# Patient Record
Sex: Female | Born: 2000 | Race: Black or African American | Hispanic: No | Marital: Single | Smoking: Never smoker
Health system: Southern US, Community
[De-identification: ages and names within clinical notes are randomized; demographics above are authoritative.]

## PROBLEM LIST (undated history)

## (undated) DIAGNOSIS — F32A Depression, unspecified: Secondary | ICD-10-CM

## (undated) DIAGNOSIS — F419 Anxiety disorder, unspecified: Secondary | ICD-10-CM

## (undated) DIAGNOSIS — E282 Polycystic ovarian syndrome: Secondary | ICD-10-CM

## (undated) DIAGNOSIS — J45909 Unspecified asthma, uncomplicated: Secondary | ICD-10-CM

## (undated) DIAGNOSIS — Z5189 Encounter for other specified aftercare: Secondary | ICD-10-CM

## (undated) DIAGNOSIS — K259 Gastric ulcer, unspecified as acute or chronic, without hemorrhage or perforation: Secondary | ICD-10-CM

## (undated) HISTORY — DX: Anxiety disorder, unspecified: F41.9

## (undated) HISTORY — DX: Depression, unspecified: F32.A

## (undated) HISTORY — DX: Encounter for other specified aftercare: Z51.89

---
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
## 2019-03-17 ENCOUNTER — Ambulatory Visit: Payer: Medicaid Other | Admitting: Family

## 2019-03-31 ENCOUNTER — Telehealth: Payer: Self-pay | Admitting: Family

## 2019-03-31 NOTE — Telephone Encounter (Signed)
Tried calling mom. Unable to LVM. The upcoming appointment with Hoyt Koch will need to be rescheduled, as she will not be available next week. Can be rescheduled for a virtual 30 min follow up with Alyse Low or Chrys Racer, and on RN schedule for labs.

## 2019-04-05 ENCOUNTER — Ambulatory Visit: Payer: Medicaid Other | Admitting: Family

## 2019-04-06 ENCOUNTER — Other Ambulatory Visit: Payer: Self-pay | Admitting: Pediatrics

## 2019-04-06 ENCOUNTER — Telehealth: Payer: Self-pay

## 2019-04-06 DIAGNOSIS — R0602 Shortness of breath: Secondary | ICD-10-CM

## 2019-04-06 MED ORDER — PROAIR HFA 108 (90 BASE) MCG/ACT IN AERS: INHALATION_SPRAY | RESPIRATORY_TRACT | 0 refills | Status: DC

## 2019-04-06 NOTE — Telephone Encounter (Signed)
Pt called upset due to rescheduled appointment due to Hoyt Koch, NP being out this week. She never received a call letting her know that appointment was cancelled. She asks for refill of asthma med (last prescribed by Chrys Racer) and RN rescheduled appointment for soonest avail virtual visit on Monday. Mom states she needed something for pain (other than Ibuprofen). Supervisor plans on calling patient and pain can be addressed at virtual visit on Monday.

## 2019-04-06 NOTE — Telephone Encounter (Signed)
Inhaler sent. Will address pain further at follow up visit.

## 2019-04-12 ENCOUNTER — Ambulatory Visit (INDEPENDENT_AMBULATORY_CARE_PROVIDER_SITE_OTHER): Payer: Medicaid Other | Admitting: Pediatrics

## 2019-04-12 DIAGNOSIS — N926 Irregular menstruation, unspecified: Secondary | ICD-10-CM | POA: Diagnosis not present

## 2019-04-12 DIAGNOSIS — E282 Polycystic ovarian syndrome: Secondary | ICD-10-CM

## 2019-04-12 DIAGNOSIS — E559 Vitamin D deficiency, unspecified: Secondary | ICD-10-CM | POA: Diagnosis not present

## 2019-04-12 DIAGNOSIS — R0602 Shortness of breath: Secondary | ICD-10-CM

## 2019-04-12 MED ORDER — PROAIR HFA 108 (90 BASE) MCG/ACT IN AERS: INHALATION_SPRAY | RESPIRATORY_TRACT | 1 refills | Status: DC

## 2019-04-12 MED ORDER — JUNEL FE 24 1-20 MG-MCG(24) PO TABS: 1.0000 | ORAL_TABLET | Freq: Every day | ORAL | 4 refills | Status: DC

## 2019-04-12 NOTE — Progress Notes (Signed)
I have reviewed the resident's note and plan of care and helped develop the plan as necessary.   Needs referral to internal/family med for pcp needs ongoing.  Will see in office in 3 months for labs and exam.  Continue ibuprofen for headaches- concern re: covid and nsaids discussed and patient agreeable.  Continue albuterol as needed.  Continue ocp, ok to stay off metformin.   Jonathon Resides, FNP

## 2019-04-12 NOTE — Progress Notes (Signed)
Virtual Visit via Video Note  I connected with Maria [name] on 04/12/19 at  4:00 PM EDT by a video enabled telemedicine application and verified that I am speaking with the correct person using two identifiers.   Location of patient/parent: home   I discussed the limitations of evaluation and management by telemedicine and the availability of in person appointments.  I discussed that the purpose of this telehealth visit is to provide medical care while limiting exposure to the novel coronavirus.  The patient expressed understanding and agreed to proceed.  Reason for visit: Follow up PCOS  History of Present Illness:  Maria [name] is a 18 year old with PCOS, asthma who was seen in video visit for follow.  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 No recent issues, school is going well.   Continues on COC without changes in menses. Was previously on Metformin but did not note a benefit so stopped taking. Denies changes in her weight. Denies issues with acne or new rashes.   Mood has been generally good. No specific complaints today. Reports uses albuterol inhaler ~3 times per week.   Gets headaches 1-2 times per month, used to take Ibuprofen but stopped due to concerns raised about NSAIDs and COVID. No other associated symptoms and do not seem to be that debilitating to her.    Observations/Objective:  Pleasant, well-appearing. Normal WOB. Alert and attentive with normal speech.   Assessment and Plan:  1. PCOS: Previously on Metformin but had stopped it as was not seeing benefit. Most recentl labs 12/2017 with HbA1c 5.4.  - Continue COC - Due for repeat labs, will need in-person visit in 3 months for labs and exam  2. Irregular Menses - Continue COC  3. Vitamin D deficiency: Vitamin D level 19 last year. Started Vitamin D supplement.  - Continue Vitamin D - Recheck level at next follow up  4. Headaches: 1-2 times  per month, responds to ibuprofen.  - Ibuprofen PRN  - Referral to PCP provider for ongoing management  5. Asthma: Uses albuterol inhaler ~3 times per week. Never been on daily controller med.  - Continue albuterol PRN - Referral to PCP provider for ongoing management  Follow Up Instructions:  3 month in-person visit for PCOS follow up and labs   I discussed the assessment and treatment plan with the patient and/or parent/guardian. They were provided an opportunity to ask questions and all were answered. They agreed with the plan and demonstrated an understanding of the instructions.   They were advised to call back or seek an in-person evaluation in the emergency room if the symptoms worsen or if the condition fails to improve as anticipated.  I spent 15 minutes on this telehealth visit inclusive of face-to-face video and care coordination time I was located remote during this encounter.  Johnnette Litter, MD

## 2019-04-22 ENCOUNTER — Other Ambulatory Visit: Payer: Self-pay | Admitting: Pediatrics

## 2019-04-22 ENCOUNTER — Telehealth: Payer: Self-pay | Admitting: Pediatrics

## 2019-04-22 DIAGNOSIS — E282 Polycystic ovarian syndrome: Secondary | ICD-10-CM

## 2019-04-22 MED ORDER — JUNEL FE 24 1-20 MG-MCG(24) PO TABS: 1.0000 | ORAL_TABLET | Freq: Every day | ORAL | 4 refills | Status: DC

## 2019-04-22 NOTE — Telephone Encounter (Signed)
Patient and patients parents called and stated that they were unable to receive the medication prescribed from the patients most recent visit with [place] on 04/12/2019. They would like for the medication to be resent. They can be contacted at the primary number in the chart if there is any questions or when the medication is re-sent to the pharmacy.

## 2019-04-22 NOTE — Telephone Encounter (Signed)
ocp resent to [facility]

## 2019-04-23 NOTE — Telephone Encounter (Signed)
Called number on file, no answer, left VM to call office back. ° °

## 2019-04-26 ENCOUNTER — Other Ambulatory Visit: Payer: Self-pay | Admitting: Pediatrics

## 2020-04-03 ENCOUNTER — Other Ambulatory Visit: Payer: Self-pay

## 2020-04-03 ENCOUNTER — Ambulatory Visit (HOSPITAL_COMMUNITY)
Admission: RE | Admit: 2020-04-03 | Discharge: 2020-04-03 | Disposition: A | Discharge status: Home / Self Care | Payer: Medicaid Other | Source: Ambulatory Visit | Attending: Internal Medicine | Admitting: Internal Medicine

## 2020-04-03 DIAGNOSIS — D649 Anemia, unspecified: Secondary | ICD-10-CM | POA: Diagnosis present

## 2020-04-03 LAB — PREPARE RBC (CROSSMATCH)

## 2020-04-03 LAB — ABO/RH: ABO/RH(D): O POS

## 2020-04-03 MED ORDER — SODIUM CHLORIDE 0.9% IV SOLUTION: Freq: Once | INTRAVENOUS | Status: AC

## 2020-04-03 NOTE — Discharge Instructions (Signed)

## 2020-04-03 NOTE — Progress Notes (Signed)
PATIENT CARE CENTER NOTE  Diagnosis: Anemia    Provider: Jamison Oka, MD   Procedure: 2 units PRBC's    Note: Type & Screen drawn and patient received 2 units of blood via PIV. Tolerated well with no adverse reaction. Vital signs remained stable. Discharge instructions given to patient and patient's mother. Patient alert, oriented and ambulatory at discharge.

## 2020-04-04 LAB — TYPE AND SCREEN
ABO/RH(D): O POS
Antibody Screen: NEGATIVE
Unit division: 0
Unit division: 0

## 2020-04-04 LAB — BPAM RBC
Blood Product Expiration Date: 202110192359
Blood Product Expiration Date: 202110192359
ISSUE DATE / TIME: 202109201045
ISSUE DATE / TIME: 202109201045
Unit Type and Rh: 5100
Unit Type and Rh: 5100

## 2021-07-09 ENCOUNTER — Ambulatory Visit
Admission: EM | Admit: 2021-07-09 | Discharge: 2021-07-09 | Disposition: A | Discharge status: Home / Self Care | Payer: Medicaid Other | Attending: Internal Medicine | Admitting: Internal Medicine

## 2021-07-09 ENCOUNTER — Other Ambulatory Visit: Payer: Self-pay

## 2021-07-09 ENCOUNTER — Encounter: Payer: Self-pay | Admitting: Emergency Medicine

## 2021-07-09 DIAGNOSIS — K219 Gastro-esophageal reflux disease without esophagitis: Secondary | ICD-10-CM | POA: Diagnosis not present

## 2021-07-09 HISTORY — DX: Unspecified asthma, uncomplicated: J45.909

## 2021-07-09 MED ORDER — OMEPRAZOLE 20 MG PO CPDR: 20.0000 mg | DELAYED_RELEASE_CAPSULE | Freq: Every day | ORAL | 0 refills | Status: DC

## 2021-07-09 MED ORDER — SUCRALFATE 1 G PO TABS: 1.0000 g | ORAL_TABLET | Freq: Three times a day (TID) | ORAL | 0 refills | Status: DC

## 2021-07-09 NOTE — ED Notes (Signed)
Unable to collect blood. 3 team members looked for veins. Provider notified.

## 2021-07-09 NOTE — Discharge Instructions (Signed)
It appears that you may have acid reflux or a stomach ulcer.  Blood work is pending.  You have been prescribed 2 medications to help alleviate symptoms.  Please follow-up with gastrointestinal specialist for further evaluation and management.  Go to the hospital if symptoms worsen.

## 2021-07-09 NOTE — ED Provider Notes (Addendum)
[facility]    CSN: [id number] Arrival date & time: 07/09/21  1137      History   Chief Complaint Chief Complaint  Patient presents with   Abdominal Pain   Emesis    HPI Maria [name] is a 20 y.o. female.   Patient presents with epigastric abdominal pain as well as nausea with vomiting that started approximately 9 days ago.  Patient reports that nausea with vomiting is intermittent and they have approximately 1-2 episodes of vomiting a day.  Denies any diarrhea or constipation.  Denies any blood in stool or emesis.  Abdominal pain is located in the epigastric area, is intermittent, and is described as a "burning sensation".  Pain is exacerbated with eating.  Denies chest pain, shortness of breath, fever.   Abdominal Pain Emesis  Past Medical History:  Diagnosis Date   Asthma    PCOS (polycystic ovarian syndrome)     Patient Active Problem List   Diagnosis Date Noted   Frontal headache 02/16/2018   Shortness of breath on exertion 12/22/2017   Extreme poverty 01/12/2015   PCOS (polycystic ovarian syndrome) 05/18/2013   BMI (body mass index), pediatric, 95-99% for age 64/10/2012   Anemia 05/18/2013   Allergic rhinitis 01/13/2013   Acne 01/13/2013   Irregular menses 01/13/2013   Unspecified constipation 01/13/2013    History reviewed. No pertinent surgical history.  OB History   No obstetric history on file.      Home Medications    Prior to Admission medications   Medication Sig Start Date End Date Taking? Authorizing Provider  omeprazole (PRILOSEC) 20 MG capsule Take 1 capsule (20 mg total) by mouth daily. 07/09/21  Yes Donna Silverman, Acie Fredrickson, FNP  sucralfate (CARAFATE) 1 g tablet Take 1 tablet (1 g total) by mouth 4 (four) times daily -  with meals and at bedtime. 07/09/21  Yes Lilianna Case, Rolly Salter E, FNP  Cholecalciferol (VITAMIN D PO) Take 1 tablet by mouth daily.    [provider]  ibuprofen (ADVIL) 800 MG tablet TAKE 1 TABLET BY MOUTH EVERY 8  HOURS AS NEEDED FOR HEADACHE 04/26/19   Alfonso Ramus T, FNP  Multiple Vitamin (MULTIVITAMIN WITH MINERALS) TABS tablet Take 1 tablet by mouth daily.    [provider]  Norethindrone Acetate-Ethinyl Estrad-FE (JUNEL FE 24) 1-20 MG-MCG(24) tablet Take 1 tablet by mouth daily. 04/22/19   Verneda Skill, FNP  PROAIR HFA 108 [phone] Base) MCG/ACT inhaler INHALE 2 PUFFS BY MOUTH EVERY 6 HOURS AS NEEDED FOR WHEEZING AND FOR SHORTNESS OF BREATH 04/12/19   Denton Meek, MD    Family History Family History  Problem Relation Age of Onset   Osteoporosis Mother     Social History Social History   Tobacco Use   Smoking status: Never   Smokeless tobacco: Never  Vaping Use   Vaping Use: Never used  Substance Use Topics   Alcohol use: Yes   Drug use: Never     Allergies   Patient has no known allergies.   Review of Systems Review of Systems Per HPI  Physical Exam Triage Vital Signs ED Triage Vitals  Enc Vitals Group     BP 07/09/21 1251 115/71     Pulse Rate 07/09/21 1251 (!) 120     Resp 07/09/21 1251 20     Temp 07/09/21 1251 98.2 F (36.8 C)     Temp Source 07/09/21 1251 Oral     SpO2 07/09/21 1251 98 %  Weight --      Height --      Head Circumference --      Peak Flow --      Pain Score 07/09/21 1248 7     Pain Loc --      Pain Edu? --      Excl. in GC? --    No data found.  Updated Vital Signs BP 115/71 (BP Location: Left Arm) Comment (BP Location): left forearm, regular cuff   Pulse (!) 120    Temp 98.2 F (36.8 C) (Oral)    Resp 20    LMP 06/04/2021    SpO2 98%   Visual Acuity Right Eye Distance:   Left Eye Distance:   Bilateral Distance:    Right Eye Near:   Left Eye Near:    Bilateral Near:     Physical Exam Constitutional:      General: She is not in acute distress.    Appearance: Normal appearance. She is not toxic-appearing or diaphoretic.  HENT:     Head: Normocephalic and atraumatic.  Eyes:     Extraocular Movements:  Extraocular movements intact.     Conjunctiva/sclera: Conjunctivae normal.  Cardiovascular:     Rate and Rhythm: Normal rate and regular rhythm.     Pulses: Normal pulses.     Heart sounds: Normal heart sounds.  Pulmonary:     Effort: Pulmonary effort is normal. No respiratory distress.     Breath sounds: Normal breath sounds.  Abdominal:     General: Bowel sounds are normal. There is no distension.     Palpations: Abdomen is soft.     Tenderness: There is no abdominal tenderness. There is no right CVA tenderness, left CVA tenderness, guarding or rebound. Negative signs include Murphy's sign, Rovsing's sign, McBurney's sign, psoas sign and obturator sign.  Skin:    General: Skin is dry.  Neurological:     General: No focal deficit present.     Mental Status: She is alert and oriented to person, place, and time. Mental status is at baseline.  Psychiatric:        Mood and Affect: Mood normal.        Behavior: Behavior normal.        Thought Content: Thought content normal.        Judgment: Judgment normal.     UC Treatments / Results  Labs (all labs ordered are listed, but only abnormal results are displayed) Labs Reviewed  COMPREHENSIVE METABOLIC PANEL  CBC    EKG   Radiology No results found.  Procedures Procedures (including critical care time)  Medications Ordered in UC Medications - No data to display  Initial Impression / Assessment and Plan / UC Course  I have reviewed the triage vital signs and the nursing notes.  Pertinent labs & imaging results that were available during my care of the patient were reviewed by me and considered in my medical decision making (see chart for details).     Patient's symptoms are consistent with GERD or stomach ulcer.  Will treat with omeprazole and Carafate.  Patient will need to follow-up with GI specialist for further evaluation management.  Patient provided with contact information for GI specialist.  Nursing staff unable  to obtain a CMP and CBC.  Advised patient to go to the hospital if symptoms significantly worsen.  Discussed strict return precautions.  Patient verbalized understanding and was agreeable with plan. Final Clinical Impressions(s) / UC Diagnoses   Final diagnoses:  Gastroesophageal reflux disease, unspecified whether esophagitis present     Discharge Instructions      It appears that you may have acid reflux or a stomach ulcer.  Blood work is pending.  You have been prescribed 2 medications to help alleviate symptoms.  Please follow-up with gastrointestinal specialist for further evaluation and management.  Go to the hospital if symptoms worsen.    ED Prescriptions     Medication Sig Dispense Auth. Provider   omeprazole (PRILOSEC) 20 MG capsule Take 1 capsule (20 mg total) by mouth daily. 90 capsule [place], [place] E, [place]   sucralfate (CARAFATE) 1 g tablet Take 1 tablet (1 g total) by mouth 4 (four) times daily -  with meals and at bedtime. 170 tablet [place], Acie Fredrickson, [place]      PDMP not reviewed this encounter.   Gustavus Bryant, [place] 07/09/21 1428    Gustavus Bryant, [place] 07/09/21 1450

## 2021-07-09 NOTE — ED Triage Notes (Signed)
Onset of abdominal pain and vomiting since 06/30/2021.  Patient refers to as reflux.  Abdominal pain is epigastric.  Center chest pain is "reflux and burns"

## 2022-11-06 ENCOUNTER — Ambulatory Visit
Admission: EM | Admit: 2022-11-06 | Discharge: 2022-11-06 | Disposition: A | Discharge status: Home / Self Care | Payer: Self-pay | Attending: Physician Assistant | Admitting: Physician Assistant

## 2022-11-06 DIAGNOSIS — R8281 Pyuria: Secondary | ICD-10-CM | POA: Insufficient documentation

## 2022-11-06 DIAGNOSIS — R3129 Other microscopic hematuria: Secondary | ICD-10-CM | POA: Insufficient documentation

## 2022-11-06 HISTORY — DX: Gastric ulcer, unspecified as acute or chronic, without hemorrhage or perforation: K25.9

## 2022-11-06 LAB — POCT URINE PREGNANCY: Preg Test, Ur: NEGATIVE

## 2022-11-06 LAB — POCT URINALYSIS DIP (MANUAL ENTRY)
Glucose, UA: NEGATIVE mg/dL
Nitrite, UA: NEGATIVE
Protein Ur, POC: NEGATIVE mg/dL
Spec Grav, UA: >=1.03 — AB
Urobilinogen, UA: 4 U/dL — AB
pH, UA: 6.5

## 2022-11-06 MED ORDER — ALUMINUM & MAGNESIUM HYDROXIDE 200-200 MG/5ML PO SUSP
20.0000 mL | Freq: Once | ORAL | Status: AC
  Administered 2022-11-06: 20 mL via ORAL

## 2022-11-06 MED ORDER — ACETAMINOPHEN 325 MG PO TABS
650.0000 mg | ORAL_TABLET | Freq: Once | ORAL | Status: AC
  Administered 2022-11-06: 650 mg via ORAL

## 2022-11-06 MED ORDER — ONDANSETRON 4 MG PO TBDP
4.0000 mg | ORAL_TABLET | Freq: Once | ORAL | Status: AC
  Administered 2022-11-06: 4 mg via ORAL

## 2022-11-06 NOTE — Discharge Instructions (Addendum)
At this time it is possible you are having reflux which may be a sign that your stomach ulcers have returned or are aggravated. I recommend the following until you can be evaluated by your PCP.   Try to take something like Pepcid  or Prilosec daily - this will prevent heartburn symptoms and you can take antacids as needed for breakthrough symptoms.  Try to eat small portions of bland foods - boiled chicken, rice, bananas, applesauce, toast, etc until you are feeling better. Once you feel like you are able to, you can try to gradually return to your normal diet as tolerated.  Make sure you are staying well hydrated with plenty of water- at least 75 oz per day. Avoid NSAIDs- things like Ibuprofen, Advil, Aleve as these will aggravate reflux and ulcers if that is what is going on.  If your pain becomes worse, you are not able to eat or drink, you develop fevers, severe vomiting, dizziness please seek prompt emergency medical attention.

## 2022-11-06 NOTE — ED Triage Notes (Signed)
Pt c/o dizziness/light-headedness since Monday. Has had 3 episodes today, in which vision somewhat blacked out. Denies any syncope. Pt also c/o nausea with epigatric pain since Monday. Hx of stomach ulcer; feels similar. Has taken antacids today with little relief.

## 2022-11-06 NOTE — ED Provider Notes (Signed)
[facility]    CSN: [id number] Arrival date & time: 11/06/22  1320      History   Chief Complaint Chief Complaint  Patient presents with   Dizziness   Nausea    HPI Maria [name] is a 22 y.o. female.   Patient reports they have had nausea, dizziness, abdominal pain ongoing since Monday  Onset: sudden  Duration: since Monday  Location: epigastric area Radiation:none  Pain level and character: 6/10 and dull  Timing: becomes instantly worse with eating  Other associated symptoms: reports some dry heaving but denies vomiting. Also reports some heartburn symptoms.  Interventions: antacid chews this AM - did not provide much benefit   Alleviating: laying down and resting  Aggravating: Eating and moving around She denies being pregnant at this time.  She reports previous hx of stomach ulcers about 1-2 years ago. Was treated to resolution per patient   She reports dizziness is more prevalent while moving to stand up but improves once position change is complete.  She reports decreased fluid intake due to stomach pain over the last few days.    The history is provided by the patient.  Dizziness Quality:  Lightheadedness Severity:  Mild Onset quality:  Unable to specify Duration:  3 days Timing:  Intermittent Context: standing up   Worsened by:  Standing up Associated symptoms: nausea   Associated symptoms: no blood in stool, no diarrhea and no vomiting     Past Medical History:  Diagnosis Date   Asthma    PCOS (polycystic ovarian syndrome)    Stomach ulcer     Patient Active Problem List   Diagnosis Date Noted   Frontal headache 02/16/2018   Shortness of breath on exertion 12/22/2017   Extreme poverty 01/12/2015   PCOS (polycystic ovarian syndrome) 05/18/2013   BMI (body mass index), pediatric, 95-99% for age 30/10/2012   Anemia 05/18/2013   Allergic rhinitis 01/13/2013   Acne 01/13/2013   Irregular menses 01/13/2013   Unspecified constipation  01/13/2013    History reviewed. No pertinent surgical history.  OB History   No obstetric history on file.      Home Medications    Prior to Admission medications   Medication Sig Start Date End Date Taking? Authorizing Provider  Cholecalciferol (VITAMIN D PO) Take 1 tablet by mouth daily.    [provider]  ibuprofen (ADVIL) 800 MG tablet TAKE 1 TABLET BY MOUTH EVERY 8 HOURS AS NEEDED FOR HEADACHE 04/26/19   Alfonso Ramus T, FNP  Multiple Vitamin (MULTIVITAMIN WITH MINERALS) TABS tablet Take 1 tablet by mouth daily.    [provider]  Norethindrone Acetate-Ethinyl Estrad-FE (JUNEL FE 24) 1-20 MG-MCG(24) tablet Take 1 tablet by mouth daily. 04/22/19   Verneda Skill, FNP  omeprazole (PRILOSEC) 20 MG capsule Take 1 capsule (20 mg total) by mouth daily. 07/09/21   Gustavus Bryant, FNP  PROAIR HFA 108 [phone] Base) MCG/ACT inhaler INHALE 2 PUFFS BY MOUTH EVERY 6 HOURS AS NEEDED FOR WHEEZING AND FOR SHORTNESS OF BREATH 04/12/19   Denton Meek, MD  sucralfate (CARAFATE) 1 g tablet Take 1 tablet (1 g total) by mouth 4 (four) times daily -  with meals and at bedtime. 07/09/21   Gustavus Bryant, FNP    Family History Family History  Problem Relation Age of Onset   Osteoporosis Mother     Social History Social History   Tobacco Use   Smoking status: Never   Smokeless tobacco: Never  Vaping Use   Vaping Use: Never used  Substance Use Topics   Alcohol use: Yes   Drug use: Never     Allergies   Patient has no known allergies.   Review of Systems Review of Systems  Constitutional:  Negative for chills and fever.  Gastrointestinal:  Positive for abdominal pain and nausea. Negative for blood in stool, constipation, diarrhea and vomiting.       Reports some heartburn symptoms this AM    Genitourinary:  Negative for difficulty urinating, dysuria and frequency.  Neurological:  Positive for dizziness.     Physical Exam Triage Vital Signs ED Triage  Vitals  Enc Vitals Group     BP 11/06/22 1411 116/84     Pulse Rate 11/06/22 1411 81     Resp 11/06/22 1411 (!) 178     Temp 11/06/22 1411 99.1 F (37.3 C)     Temp Source 11/06/22 1411 Oral     SpO2 11/06/22 1411 99 %     Weight --      Height --      Head Circumference --      Peak Flow --      Pain Score 11/06/22 1408 6     Pain Loc --      Pain Edu? --      Excl. in GC? --    No data found.  Updated Vital Signs BP 116/84 (BP Location: Right Arm)   Pulse 81   Temp 99.1 F (37.3 C) (Oral)   Resp (!) 178   LMP 10/14/2022 (Approximate)   SpO2 99%   Visual Acuity Right Eye Distance:   Left Eye Distance:   Bilateral Distance:    Right Eye Near:   Left Eye Near:    Bilateral Near:     Physical Exam Vitals reviewed.  Constitutional:      General: She is awake.     Appearance: Normal appearance. She is well-developed and well-groomed.  HENT:     Head: Normocephalic and atraumatic.     Mouth/Throat:     Lips: Pink.     Mouth: Mucous membranes are moist.  Eyes:     Extraocular Movements: Extraocular movements intact.     Conjunctiva/sclera: Conjunctivae normal.     Pupils: Pupils are equal, round, and reactive to light.  Pulmonary:     Effort: Pulmonary effort is normal.  Abdominal:     General: Abdomen is protuberant. Bowel sounds are normal.     Palpations: Abdomen is soft.     Tenderness: There is abdominal tenderness in the epigastric area and periumbilical area. There is no rebound. Negative signs include Murphy's sign and McBurney's sign.  Musculoskeletal:     Cervical back: Normal range of motion.  Skin:    General: Skin is warm and dry.  Neurological:     General: No focal deficit present.     Mental Status: She is alert and oriented to person, place, and time.  Psychiatric:        Mood and Affect: Mood normal.        Behavior: Behavior normal. Behavior is cooperative.        Thought Content: Thought content normal.        Judgment: Judgment  normal.      UC Treatments / Results  Labs (all labs ordered are listed, but only abnormal results are displayed) Labs Reviewed  POCT URINALYSIS DIP (MANUAL ENTRY) - Abnormal; Notable for the following components:  Result Value   Bilirubin, UA small (*)    Ketones, POC UA small (15) (*)    Spec Grav, UA >=1.030 (*)    Blood, UA moderate (*)    Urobilinogen, UA 4.0 (*)    Leukocytes, UA Trace (*)    All other components within normal limits  URINALYSIS, W/ REFLEX TO CULTURE (INFECTION SUSPECTED)  POCT URINE PREGNANCY    EKG   Radiology No results found.  Procedures Procedures (including critical care time)  Medications Ordered in UC Medications  ondansetron (ZOFRAN-ODT) disintegrating tablet 4 mg (4 mg Oral Given 11/06/22 1448)  acetaminophen (TYLENOL) tablet 650 mg (650 mg Oral Given 11/06/22 1448)  aluminum-magnesium hydroxide 200-200 MG/5ML suspension 20 mL (20 mLs Oral Given 11/06/22 1448)    Initial Impression / Assessment and Plan / UC Course  I have reviewed the triage vital signs and the nursing notes.  Pertinent labs & imaging results that were available during my care of the patient were reviewed by me and considered in my medical decision making (see chart for details).  Dizziness Suspect this is secondary to decreased PO intake and likely hypovolemia Recommend staying well hydrated and eating small bland meals until able to tolerate normal diet.  Follow up with PCP for persistent or progressing symptoms  Epigastric pain and nausea Acute, new concern Patient has reported history of reflux and self-reported stomach ulcers.  Suspect that this might be aggravated at this time.  Zofran, Maalox, Tylenol administered here at urgent care.  Recommend that she discontinue any NSAIDs if she is currently using them.  Reviewed eating small bland meals and staying well-hydrated to prevent further aggravation.  Diet recommendations for GERD provided in AVS.  Also  recommend using daily Pepcid or Prilosec along with as needed antacids for breakthrough.  Recommend follow-up with PCP if symptoms persist or progress.  Hematuria and pyuria Due to concerns of epigastric and suprapubic pain UA was collected.  UA was positive for trace leukocytes as well as moderate blood.  Results of UA discussed with patient during appointment.  Since patient denies concerns for dysuria, increased urinary frequency, other urinary symptoms we will send off a culture before starting ABX therapy.  Results of culture to dictate further management.  Follow-up as needed.      Final Clinical Impressions(s) / UC Diagnoses   Final diagnoses:  Other microscopic hematuria  Pyuria     Discharge Instructions      At this time it is possible you are having reflux which may be a sign that your stomach ulcers have returned or are aggravated. I recommend the following until you can be evaluated by your PCP.   Try to take something like Pepcid daily - this will prevent heartburn symptoms and you can take antacids as needed for breakthrough symptoms.  Try to eat small portions of bland foods - boiled chicken, rice, bananas, applesauce, toast, etc until you are feeling better. Once you feel like you are able to, you can try to gradually return to your normal diet as tolerated.  Make sure you are staying well hydrated with plenty of water- at least 75 oz per day. Avoid NSAIDs- things like Ibuprofen, Advil, Aleve as these will aggravate reflux and ulcers if that is what is going on.  If your pain becomes worse, you are not able to eat or drink, you develop fevers, severe vomiting, dizziness please seek prompt emergency medical attention.      ED Prescriptions   None  PDMP not reviewed this encounter.   Jessa Stinson, Oswaldo Conroy, PA-C 11/06/22 1525

## 2022-11-07 LAB — URINE CULTURE

## 2024-03-04 ENCOUNTER — Encounter: Admitting: Radiology

## 2024-04-12 ENCOUNTER — Encounter: Payer: Self-pay | Admitting: Nurse Practitioner

## 2024-04-12 ENCOUNTER — Ambulatory Visit: Admitting: Nurse Practitioner

## 2024-04-12 ENCOUNTER — Other Ambulatory Visit (HOSPITAL_COMMUNITY)
Admission: RE | Admit: 2024-04-12 | Discharge: 2024-04-12 | Disposition: A | Discharge status: Home / Self Care | Source: Ambulatory Visit | Attending: Nurse Practitioner | Admitting: Nurse Practitioner

## 2024-04-12 VITALS — BP 110/76 | HR 81 | Ht 72.25 in | Wt 317.0 lb

## 2024-04-12 DIAGNOSIS — Z1331 Encounter for screening for depression: Secondary | ICD-10-CM

## 2024-04-12 DIAGNOSIS — Z124 Encounter for screening for malignant neoplasm of cervix: Secondary | ICD-10-CM

## 2024-04-12 DIAGNOSIS — E282 Polycystic ovarian syndrome: Secondary | ICD-10-CM | POA: Diagnosis not present

## 2024-04-12 DIAGNOSIS — Z01419 Encounter for gynecological examination (general) (routine) without abnormal findings: Secondary | ICD-10-CM | POA: Insufficient documentation

## 2024-04-12 DIAGNOSIS — Z3041 Encounter for surveillance of contraceptive pills: Secondary | ICD-10-CM

## 2024-04-12 MED ORDER — MELEYA 0.35 MG PO TABS: 1.0000 | ORAL_TABLET | Freq: Every day | ORAL | 3 refills | Status: AC

## 2024-04-12 NOTE — Progress Notes (Signed)
   Maria [name] 2001/01/29 [id number]   History:  23 y.o. G0 presents as new patient to establish care.No GYN complaints. H/O PCOS. POPs.  Gynecologic History Patient's last menstrual period was 04/01/2024 (exact date). Period Duration (Days): 5 Period Pattern: (!) Irregular Menstrual Flow: Moderate Menstrual Control: Maxi pad Dysmenorrhea: None Contraception/Family planning: oral progesterone-only contraceptive Sexually active: Yes, declines STD screening  Health Maintenance Last Pap: Never Last mammogram: Not indicated Last colonoscopy: Not indicated Last Dexa: Not indicated  Past medical history, past surgical history, family history and social history were all reviewed and documented in the EPIC chart.  ROS:  A ROS was performed and pertinent positives and negatives are included.  Exam:  Vitals:   04/12/24 1117  BP: 110/76  Pulse: 81  SpO2: 97%  Weight: (!) 317 lb (143.8 kg)  Height: 6' 0.25 (1.835 m)   Body mass index is 42.7 kg/m.  General appearance:  Normal Thyroid:  Symmetrical, normal in size, without palpable masses or nodularity. Respiratory  Auscultation:  Clear without wheezing or rhonchi Cardiovascular  Auscultation:  Regular rate, without rubs, murmurs or gallops  Edema/varicosities:  Not grossly evident Abdominal  Soft,nontender, without masses, guarding or rebound.  Liver/spleen:  No organomegaly noted  Hernia:  None appreciated  Skin  Inspection:  Grossly normal Breasts: Examined lying and sitting.   Right: Without masses, retractions, nipple discharge or axillary adenopathy.   Left: Without masses, retractions, nipple discharge or axillary adenopathy. Pelvic: External genitalia:  no lesions              Urethra:  normal appearing urethra with no masses, tenderness or lesions              Bartholins and Skenes: normal                 Vagina: normal appearing vagina with normal color and discharge, no lesions              Cervix: no  lesions Bimanual Exam:  Uterus:  no masses or tenderness              Adnexa: no mass, fullness, tenderness              Rectovaginal: Deferred              Anus:  normal, no lesions  Zada Louder, CMA present as chaperone.   Assessment/Plan:  23 y.o. G0 to establish care.   Well female exam with routine gynecological exam - Plan: Cytology - PAP( [place]). Education provided on SBEs, importance of preventative screenings, current guidelines, high calcium diet, regular exercise, and multivitamin daily. Has PCP.   PCOS (polycystic ovarian syndrome) - Plan: MELEYA 0.35 MG tablet daily. Good management.   Encounter for surveillance of contraceptive pills - Plan: MELEYA 0.35 MG tablet daily. Taking as prescribed.   Cervical cancer screening - Plan: Cytology - PAP( [place]). Initial pap today.   Return in about 1 year (around 04/12/2025) for Annual.    Annabella DELENA Shutter DNP, 11:49 AM 04/12/2024

## 2024-04-14 ENCOUNTER — Other Ambulatory Visit: Payer: Self-pay | Admitting: Nurse Practitioner

## 2024-04-14 ENCOUNTER — Ambulatory Visit: Payer: Self-pay | Admitting: Nurse Practitioner

## 2024-04-14 DIAGNOSIS — B9689 Other specified bacterial agents as the cause of diseases classified elsewhere: Secondary | ICD-10-CM

## 2024-04-14 LAB — CYTOLOGY - PAP

## 2024-04-14 MED ORDER — METRONIDAZOLE 500 MG PO TABS: 500.0000 mg | ORAL_TABLET | Freq: Two times a day (BID) | ORAL | 0 refills | Status: AC
